# Patient Record
Sex: Male | Born: 1965 | Race: White | Hispanic: No | Marital: Married | Smoking: Former smoker
Health system: Southern US, Community
[De-identification: ages and names within clinical notes are randomized; demographics above are authoritative.]

## PROBLEM LIST (undated history)

## (undated) DIAGNOSIS — Z789 Other specified health status: Secondary | ICD-10-CM

## (undated) HISTORY — PX: ANKLE SURGERY: SHX546

## (undated) HISTORY — PX: SHOULDER SURGERY: SHX246

## (undated) HISTORY — PX: KNEE ARTHROSCOPY: SHX127

## (undated) HISTORY — PX: INGUINAL HERNIA REPAIR: SHX194

---

## 2009-09-25 ENCOUNTER — Ambulatory Visit: Payer: Self-pay | Admitting: Family Medicine

## 2011-12-17 ENCOUNTER — Other Ambulatory Visit: Payer: Self-pay | Admitting: Orthopedic Surgery

## 2011-12-17 DIAGNOSIS — R52 Pain, unspecified: Secondary | ICD-10-CM

## 2011-12-17 DIAGNOSIS — M25619 Stiffness of unspecified shoulder, not elsewhere classified: Secondary | ICD-10-CM

## 2011-12-22 ENCOUNTER — Other Ambulatory Visit: Payer: Self-pay

## 2011-12-23 ENCOUNTER — Ambulatory Visit
Admission: RE | Admit: 2011-12-23 | Discharge: 2011-12-23 | Disposition: A | Discharge status: Home / Self Care | Payer: BC Managed Care – PPO | Source: Ambulatory Visit | Attending: Orthopedic Surgery | Admitting: Orthopedic Surgery

## 2011-12-23 DIAGNOSIS — M25619 Stiffness of unspecified shoulder, not elsewhere classified: Secondary | ICD-10-CM

## 2011-12-23 DIAGNOSIS — R52 Pain, unspecified: Secondary | ICD-10-CM

## 2012-03-07 ENCOUNTER — Encounter (HOSPITAL_BASED_OUTPATIENT_CLINIC_OR_DEPARTMENT_OTHER): Payer: Self-pay | Admitting: *Deleted

## 2012-03-07 ENCOUNTER — Other Ambulatory Visit: Payer: Self-pay | Admitting: Orthopedic Surgery

## 2012-03-13 ENCOUNTER — Ambulatory Visit (HOSPITAL_BASED_OUTPATIENT_CLINIC_OR_DEPARTMENT_OTHER): Payer: BC Managed Care – PPO | Admitting: Anesthesiology

## 2012-03-13 ENCOUNTER — Encounter (HOSPITAL_BASED_OUTPATIENT_CLINIC_OR_DEPARTMENT_OTHER): Payer: Self-pay | Admitting: Anesthesiology

## 2012-03-13 ENCOUNTER — Encounter (HOSPITAL_BASED_OUTPATIENT_CLINIC_OR_DEPARTMENT_OTHER)
Admission: RE | Disposition: A | Discharge status: Home / Self Care | Payer: Self-pay | Source: Ambulatory Visit | Attending: Orthopedic Surgery

## 2012-03-13 ENCOUNTER — Encounter (HOSPITAL_BASED_OUTPATIENT_CLINIC_OR_DEPARTMENT_OTHER): Payer: Self-pay | Admitting: Certified Registered"

## 2012-03-13 ENCOUNTER — Ambulatory Visit (HOSPITAL_BASED_OUTPATIENT_CLINIC_OR_DEPARTMENT_OTHER)
Admission: RE | Admit: 2012-03-13 | Discharge: 2012-03-13 | Disposition: A | Discharge status: Home / Self Care | Payer: BC Managed Care – PPO | Source: Ambulatory Visit | Attending: Orthopedic Surgery | Admitting: Orthopedic Surgery

## 2012-03-13 DIAGNOSIS — M659 Unspecified synovitis and tenosynovitis, unspecified site: Secondary | ICD-10-CM | POA: Insufficient documentation

## 2012-03-13 DIAGNOSIS — M19019 Primary osteoarthritis, unspecified shoulder: Secondary | ICD-10-CM

## 2012-03-13 DIAGNOSIS — M24119 Other articular cartilage disorders, unspecified shoulder: Secondary | ICD-10-CM | POA: Insufficient documentation

## 2012-03-13 HISTORY — PX: SHOULDER ARTHROSCOPY: SHX128

## 2012-03-13 HISTORY — DX: Other specified health status: Z78.9

## 2012-03-13 SURGERY — ARTHROSCOPY, SHOULDER
Anesthesia: General | Site: Shoulder | Laterality: Left | Wound class: Clean

## 2012-03-13 MED ORDER — OXYCODONE-ACETAMINOPHEN 5-325 MG PO TABS: 1.0000 | ORAL_TABLET | ORAL | Status: AC | PRN

## 2012-03-13 MED ORDER — MIDAZOLAM HCL 2 MG/2ML IJ SOLN
1.0000 mg | INTRAMUSCULAR | Status: DC | PRN
  Administered 2012-03-13: 2 mg via INTRAVENOUS

## 2012-03-13 MED ORDER — FENTANYL CITRATE 0.05 MG/ML IJ SOLN
INTRAMUSCULAR | Status: DC | PRN
  Administered 2012-03-13: 25 ug via INTRAVENOUS

## 2012-03-13 MED ORDER — LACTATED RINGERS IV SOLN
INTRAVENOUS | Status: DC
  Administered 2012-03-13 (×3): via INTRAVENOUS

## 2012-03-13 MED ORDER — FENTANYL CITRATE 0.05 MG/ML IJ SOLN
50.0000 ug | INTRAMUSCULAR | Status: DC | PRN
  Administered 2012-03-13: 100 ug via INTRAVENOUS

## 2012-03-13 MED ORDER — SUCCINYLCHOLINE CHLORIDE 20 MG/ML IJ SOLN
INTRAMUSCULAR | Status: DC | PRN
  Administered 2012-03-13: 100 mg via INTRAVENOUS

## 2012-03-13 MED ORDER — ONDANSETRON HCL 4 MG/2ML IJ SOLN
INTRAMUSCULAR | Status: DC | PRN
  Administered 2012-03-13: 4 mg via INTRAVENOUS

## 2012-03-13 MED ORDER — HYDROMORPHONE HCL PF 1 MG/ML IJ SOLN: 0.2500 mg | INTRAMUSCULAR | Status: DC | PRN

## 2012-03-13 MED ORDER — SODIUM CHLORIDE 0.9 % IR SOLN
Status: DC | PRN
  Administered 2012-03-13: 3000 mL

## 2012-03-13 MED ORDER — CEFAZOLIN SODIUM-DEXTROSE 2-3 GM-% IV SOLR
2.0000 g | INTRAVENOUS | Status: AC
  Administered 2012-03-13: 2 g via INTRAVENOUS

## 2012-03-13 MED ORDER — ONDANSETRON HCL 4 MG/2ML IJ SOLN: 4.0000 mg | Freq: Once | INTRAMUSCULAR | Status: DC | PRN

## 2012-03-13 MED ORDER — DEXAMETHASONE SODIUM PHOSPHATE 4 MG/ML IJ SOLN
INTRAMUSCULAR | Status: DC | PRN
  Administered 2012-03-13: 10 mg via INTRAVENOUS

## 2012-03-13 MED ORDER — POVIDONE-IODINE 7.5 % EX SOLN
Freq: Once | CUTANEOUS | Status: AC
  Administered 2012-03-13: 1 via TOPICAL

## 2012-03-13 MED ORDER — PROPOFOL 10 MG/ML IV EMUL
INTRAVENOUS | Status: DC | PRN
  Administered 2012-03-13: 200 mg via INTRAVENOUS

## 2012-03-13 MED ORDER — LIDOCAINE HCL (CARDIAC) 20 MG/ML IV SOLN
INTRAVENOUS | Status: DC | PRN
  Administered 2012-03-13: 50 mg via INTRAVENOUS

## 2012-03-13 SURGICAL SUPPLY — 88 items
ADH SKN CLS APL DERMABOND .7 (GAUZE/BANDAGES/DRESSINGS)
APL SKNCLS STERI-STRIP NONHPOA (GAUZE/BANDAGES/DRESSINGS)
BENZOIN TINCTURE PRP APPL 2/3 (GAUZE/BANDAGES/DRESSINGS) IMPLANT
BLADE 4.2CUDA (BLADE) IMPLANT
BLADE CUDA 5.5 (BLADE) IMPLANT
BLADE CUTTER GATOR 3.5 (BLADE) IMPLANT
BLADE GREAT WHITE 4.2 (BLADE) IMPLANT
BLADE SURG 15 STRL LF DISP TIS (BLADE) IMPLANT
BLADE SURG 15 STRL SS (BLADE)
BLADE SURG ROTATE 9660 (MISCELLANEOUS) IMPLANT
BLADE VORTEX 6.0 (BLADE) IMPLANT
BUR 3.5 LG SPHERICAL (BURR) IMPLANT
BUR OVAL 4.0 (BURR) ×1 IMPLANT
BUR OVAL 6.0 (BURR) IMPLANT
BURR 3.5 LG SPHERICAL (BURR)
CANISTER OMNI JUG 16 LITER (MISCELLANEOUS) ×2 IMPLANT
CANISTER SUCTION 2500CC (MISCELLANEOUS) IMPLANT
CANNULA 5.75X71 LONG (CANNULA) ×2 IMPLANT
CANNULA TWIST IN 8.25X7CM (CANNULA) IMPLANT
CHLORAPREP W/TINT 26ML (MISCELLANEOUS) ×2 IMPLANT
CLOTH BEACON ORANGE TIMEOUT ST (SAFETY) ×2 IMPLANT
DECANTER SPIKE VIAL GLASS SM (MISCELLANEOUS) IMPLANT
DERMABOND ADVANCED (GAUZE/BANDAGES/DRESSINGS)
DERMABOND ADVANCED .7 DNX12 (GAUZE/BANDAGES/DRESSINGS) IMPLANT
DRAPE INCISE IOBAN 66X45 STRL (DRAPES) ×2 IMPLANT
DRAPE STERI 35X30 U-POUCH (DRAPES) ×2 IMPLANT
DRAPE SURG 17X23 STRL (DRAPES) ×2 IMPLANT
DRAPE U 20/CS (DRAPES) ×1 IMPLANT
DRAPE U-SHAPE 47X51 STRL (DRAPES) ×2 IMPLANT
DRAPE U-SHAPE 76X120 STRL (DRAPES) ×4 IMPLANT
DRSG PAD ABDOMINAL 8X10 ST (GAUZE/BANDAGES/DRESSINGS) ×2 IMPLANT
ELECT REM PT RETURN 9FT ADLT (ELECTROSURGICAL)
ELECTRODE REM PT RTRN 9FT ADLT (ELECTROSURGICAL) ×1 IMPLANT
GAUZE SPONGE 4X4 16PLY XRAY LF (GAUZE/BANDAGES/DRESSINGS) IMPLANT
GAUZE XEROFORM 1X8 LF (GAUZE/BANDAGES/DRESSINGS) ×2 IMPLANT
GLOVE BIO SURGEON STRL SZ 6.5 (GLOVE) ×1 IMPLANT
GLOVE BIO SURGEON STRL SZ7.5 (GLOVE) ×4 IMPLANT
GLOVE BIOGEL PI IND STRL 7.0 (GLOVE) IMPLANT
GLOVE BIOGEL PI IND STRL 8 (GLOVE) ×2 IMPLANT
GLOVE BIOGEL PI INDICATOR 7.0 (GLOVE) ×1
GLOVE BIOGEL PI INDICATOR 8 (GLOVE) ×1
GOWN PREVENTION PLUS XLARGE (GOWN DISPOSABLE) ×3 IMPLANT
NDL 1/2 CIR CATGUT .05X1.09 (NEEDLE) IMPLANT
NDL SCORPION MULTI FIRE (NEEDLE) IMPLANT
NDL SUT 6 .5 CRC .975X.05 MAYO (NEEDLE) IMPLANT
NEEDLE 1/2 CIR CATGUT .05X1.09 (NEEDLE) IMPLANT
NEEDLE MAYO TAPER (NEEDLE)
NEEDLE SCORPION MULTI FIRE (NEEDLE) IMPLANT
NS IRRIG 1000ML POUR BTL (IV SOLUTION) IMPLANT
PACK ARTHROSCOPY DSU (CUSTOM PROCEDURE TRAY) ×2 IMPLANT
PACK BASIN DAY SURGERY FS (CUSTOM PROCEDURE TRAY) ×2 IMPLANT
PENCIL BUTTON HOLSTER BLD 10FT (ELECTRODE) IMPLANT
RESECTOR FULL RADIUS 4.2MM (BLADE) ×2 IMPLANT
RESECTOR FULL RADIUS 4.8MM (BLADE) IMPLANT
SHEET MEDIUM DRAPE 40X70 STRL (DRAPES) ×1 IMPLANT
SLEEVE SCD COMPRESS KNEE MED (MISCELLANEOUS) ×2 IMPLANT
SLING ARM FOAM STRAP LRG (SOFTGOODS) IMPLANT
SLING ARM FOAM STRAP MED (SOFTGOODS) IMPLANT
SLING ARM FOAM STRAP XLG (SOFTGOODS) ×1 IMPLANT
SLING ARM IMMOBILIZER MED (SOFTGOODS) IMPLANT
SPONGE GAUZE 4X4 12PLY (GAUZE/BANDAGES/DRESSINGS) ×2 IMPLANT
SPONGE LAP 4X18 X RAY DECT (DISPOSABLE) IMPLANT
STRIP CLOSURE SKIN 1/2X4 (GAUZE/BANDAGES/DRESSINGS) IMPLANT
SUCTION FRAZIER TIP 10 FR DISP (SUCTIONS) IMPLANT
SUPPORT WRAP ARM LG (MISCELLANEOUS) IMPLANT
SUT BONE WAX W31G (SUTURE) IMPLANT
SUT ETHIBOND 2 OS 4 DA (SUTURE) IMPLANT
SUT ETHILON 3 0 PS 1 (SUTURE) ×2 IMPLANT
SUT ETHILON 4 0 PS 2 18 (SUTURE) IMPLANT
SUT FIBERWIRE #2 38 T-5 BLUE (SUTURE)
SUT FIBERWIRE 2-0 18 17.9 3/8 (SUTURE)
SUT MNCRL AB 3-0 PS2 18 (SUTURE) IMPLANT
SUT MNCRL AB 4-0 PS2 18 (SUTURE) IMPLANT
SUT PDS AB 0 CT 36 (SUTURE) IMPLANT
SUT PROLENE 3 0 PS 2 (SUTURE) IMPLANT
SUT VIC AB 0 CT1 18XCR BRD 8 (SUTURE) IMPLANT
SUT VIC AB 0 CT1 8-18 (SUTURE)
SUT VIC AB 2-0 SH 18 (SUTURE) IMPLANT
SUTURE FIBERWR #2 38 T-5 BLUE (SUTURE) IMPLANT
SUTURE FIBERWR 2-0 18 17.9 3/8 (SUTURE) IMPLANT
SYR BULB 3OZ (MISCELLANEOUS) IMPLANT
TOWEL OR 17X24 6PK STRL BLUE (TOWEL DISPOSABLE) ×2 IMPLANT
TOWEL OR NON WOVEN STRL DISP B (DISPOSABLE) ×2 IMPLANT
TUBE CONNECTING 20X1/4 (TUBING) ×2 IMPLANT
TUBING ARTHROSCOPY IRRIG 16FT (MISCELLANEOUS) ×2 IMPLANT
WAND STAR VAC 90 (SURGICAL WAND) ×2 IMPLANT
WATER STERILE IRR 1000ML POUR (IV SOLUTION) ×2 IMPLANT
YANKAUER SUCT BULB TIP NO VENT (SUCTIONS) IMPLANT

## 2012-03-13 NOTE — Transfer of Care (Signed)
Immediate Anesthesia Transfer of Care Note  Patient: Leon Coffey  Procedure(s) Performed: Procedure(s) (LRB): ARTHROSCOPY SHOULDER (Left)  Patient Location: PACU  Anesthesia Type: GA combined with regional for post-op pain  Level of Consciousness: awake, alert , oriented and patient cooperative  Airway & Oxygen Therapy: Patient Spontanous Breathing and Patient connected to face mask oxygen  Post-op Assessment: Report given to PACU RN and Post -op Vital signs reviewed and stable  Post vital signs: Reviewed and stable  Complications: No apparent anesthesia complications

## 2012-03-13 NOTE — H&P (Signed)
Leon Coffey is an 46 y.o. male.   Chief Complaint: L shoulder pain  HPI: L shoulder pain, mechanical symptoms after prior bony stabilization procedure with loose bodies.   Past Medical History  Diagnosis Date  . No pertinent past medical history     Past Surgical History  Procedure Date  . Inguinal hernia repair     left  . Knee arthroscopy     right  . Shoulder surgery     pinning lt shoulder age 65  . Ankle surgery     right cyst removal and bunionectomy    No family history on file. Social History:  reports that he quit smoking about 18 years ago. He does not have any smokeless tobacco history on file. He reports that he drinks alcohol. He reports that he does not use illicit drugs.  Allergies: No Known Allergies  Medications Prior to Admission  Medication Sig Dispense Refill  . acetaminophen (TYLENOL) 325 MG tablet Take 650 mg by mouth every 6 (six) hours as needed.        No results found for this or any previous visit (from the past 48 hour(s)). No results found.  Review of Systems  All other systems reviewed and are negative.    Blood pressure 127/68, pulse 86, temperature 98.7 F (37.1 C), temperature source Oral, resp. rate 16, height 5\' 7"  (1.702 m), weight 88.451 kg (195 lb), SpO2 100.00%. Physical Exam  Constitutional: He is oriented to person, place, and time. He appears well-developed and well-nourished.  HENT:  Head: Atraumatic.  Eyes: EOM are normal.  Cardiovascular: Intact distal pulses.   Respiratory: Effort normal.  Musculoskeletal:       Left shoulder: He exhibits pain.  Neurological: He is alert and oriented to person, place, and time.  Skin: Skin is warm and dry.  Psychiatric: He has a normal mood and affect.     Assessment/Plan L shoulder early OA, loose bodies Plan arth debridement and removal of LBs Risks / benefits of surgery discussed Consent on chart  NPO for OR Preop antibiotics   Kaithlyn Teagle WILLIAM 03/13/2012,  12:06 PM

## 2012-03-13 NOTE — Op Note (Signed)
Procedure(s): ARTHROSCOPY SHOULDER Procedure Note  Leon Coffey male 46 y.o. 03/13/2012  Procedure(s) and Anesthesia Type:    * ARTHROSCOPY SHOULDER - General #1 left shoulder arthroscopic extensive debridement of glenohumeral arthritis, superior labral tear, and extensive synovitis.  Postoperative diagnosis: Left shoulder osteoarthritis, superior labral tear, extensive synovitis.  Surgeon(s) and Role:    * Mable Paris, MD - Primary     Surgeon: Mable Paris   Assistants: None  Anesthesia: General endotracheal anesthesia with preoperative interscalene block   Procedure Detail  ARTHROSCOPY SHOULDER  Estimated Blood Loss: Min         Drains: none  Blood Given: none         Specimens: none        Complications:  * No complications entered in OR log *         Disposition: PACU - hemodynamically stable.         Condition: stable    Procedure:   INDICATIONS FOR SURGERY: The patient is 46 y.o. male who had a history of a bone block procedure for instability many years ago. Began having more pain and some mechanical symptoms in the shoulder. He was noted to have some early arthritis and some chondral loose bodies in the joint. He was indicated for arthroscopic treatment to debride the arthritis and loose bodies. We talked about the presence of the anterior screw. Based on the CT scan this was felt to be in good position and not likely to be prominent. We talked about examining it arthroscopically if it appeared to be past the plane of the glenoid and causing mechanical irritation that we could remove it. Otherwise we would likely leave it intact.  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 The remainder of the humeral head looked good. Labrum: Degenerative. The superior labrum was torn and subluxating into the joint. This was debrided back to stable base. Has not felt the biceps root was involved. The biceps tendon itself had some fraying which was debrided. The remainder the biceps looked healthy. Loose bodies: There were some tiny chondral loose fragments which were debrided with the shaver. No large loose bodies were noted. Synovitis: There is some mild diffuse synovitis throughout the joint. Articular sided rotator cuff: Intact. Mild fraying, debrided  DESCRIPTION OF PROCEDURE: The patient was identified in preoperative  holding area where I personally marked the operative site after  verifying site, side, and procedure with the patient. An interscalene block was given by the attending anesthesiologist the holding area.  The patient was taken back to the operating room where general anesthesia was induced without complication and was placed in the beach-chair position with the back  elevated about 60 degrees and all extremities and head and neck carefully padded and  positioned.   The left upper extremity was then prepped and  draped in a standard sterile fashion. The appropriate time-out  procedure was carried out. The patient did receive IV antibiotics  within 30 minutes of incision.   A small posterior portal incision was made and the arthroscope was introduced into the joint. An anterior portal was then established above the subscapularis using needle localization. Small cannula was placed anteriorly. Diagnostic arthroscopy was then carried out with findings as  described above.  The shaver was used through the anterior portal to debride the extensive degenerative labral tearing both anteriorly and superiorly. The articular cartilage was carefully examined. He had some extensive grade 3 and 4 changes in the posterior inferior aspect of the  glenoid. This was debrided down to stable cartilage with no displaceable flaps. The cartilage on the humeral head was largely intact. There was a stripe of anterior cartilage loss on the anterior quarter of the humeral head which had some areas of grade 4 changes. The subscapularis anteriorly was degenerative and thin. This was debrided. The biceps tendon was pulled into the joint and had some mild partial tearing which was debrided but largely intact. Superior labrum was debrided back to stable labrum. The biceps root was not felt to be involved. The axillary recess was examined. There were no loose bodies in this area. There was a small area of some darkened synovium consistent with a little area of metallosis. This was debrided. The screw and washer were found anteriorly. This was carefully viewed from multiple different angles and felt to be medial to the plane of the articular surface. I viewed from anterior and posterior portals and brought the arms through a range of motion ensuring that the humeral head was never in contact with the screw. I did place a screw driver through the anterior portal to see if the screw would come out easily. It would not come easy and was beginning to have some mental scrapings in the joint. Therefore I felt that the potential harm of removing the screw was probably greater than any benefit and left it intact. The undersurface of the rotator cuff was carefully examined. There was some partial thickness fraying which was debrided but the remainder the rotator cuff appeared very healthy. Viewing from the anterior portal  I used the shaver from the posterior portal to debride the chondromalacia of the glenoid.  The arthroscopic equipment was removed from the joint and the portals were closed with 3-0 nylon in an interrupted fashion. Sterile dressings were then applied including Xeroform 4 x 4's ABDs and tape. The patient was then allowed to awaken from general anesthesia, placed in  a sling, transferred to the stretcher and taken to the recovery room in stable condition.   POSTOPERATIVE PLAN: The patient will be discharged home today and will followup in one week for suture removal and wound check.  He'll begin some gentle range of motion exercises when comfortable.

## 2012-03-13 NOTE — Anesthesia Preprocedure Evaluation (Signed)
Anesthesia Evaluation  Patient identified by MRN, date of birth, ID band Patient awake    Reviewed: Allergy & Precautions, H&P , NPO status   Airway Mallampati: II TM Distance: >3 FB Neck ROM: Full    Dental  (+) Teeth Intact   Pulmonary  breath sounds clear to auscultation        Cardiovascular Rhythm:Regular Rate:Normal     Neuro/Psych    GI/Hepatic   Endo/Other    Renal/GU      Musculoskeletal   Abdominal   Peds  Hematology   Anesthesia Other Findings   Reproductive/Obstetrics                           Anesthesia Physical Anesthesia Plan  ASA: I  Anesthesia Plan: General   Post-op Pain Management:    Induction: Intravenous  Airway Management Planned: Oral ETT  Additional Equipment:   Intra-op Plan:   Post-operative Plan: Extubation in OR  Informed Consent: I have reviewed the patients History and Physical, chart, labs and discussed the procedure including the risks, benefits and alternatives for the proposed anesthesia with the patient or authorized representative who has indicated his/her understanding and acceptance.   Dental advisory given  Plan Discussed with:   Anesthesia Plan Comments: (Plan GA with interscalene block.  Kipp Brood, MD)        Anesthesia Quick Evaluation

## 2012-03-13 NOTE — Discharge Instructions (Signed)
Post Anesthesia Home Care Instructions  Activity: Get plenty of rest for the remainder of the day. A responsible adult should stay with you for 24 hours following the procedure.  For the next 24 hours, DO NOT: -Drive a car -Advertising copywriter -Drink alcoholic beverages -Take any medication unless instructed by your physician -Make any legal decisions or sign important papers.  Meals: Start with liquid foods such as gelatin or soup. Progress to regular foods as tolerated. Avoid greasy, spicy, heavy foods. If nausea and/or vomiting occur, drink only clear liquids until the nausea and/or vomiting subsides. Call your physician if vomiting continues.  Special Instructions/Symptoms: Your throat may feel dry or sore from the anesthesia or the breathing tube placed in your throat during surgery. If this causes discomfort, gargle with warm salt water. The discomfort should disappear within 24 hours.  Regional Anesthesia Blocks  1. Numbness or the inability to move the "blocked" extremity may last from 3-48 hours after placement. The length of time depends on the medication injected and your individual response to the medication. If the numbness is not going away after 48 hours, call your surgeon.  2. The extremity that is blocked will need to be protected until the numbness is gone and the  Strength has returned. Because you cannot feel it, you will need to take extra care to avoid injury. Because it may be weak, you may have difficulty moving it or using it. You may not know what position it is in without looking at it while the block is in effect.  3. For blocks in the legs and feet, returning to weight bearing and walking needs to be done carefully. You will need to wait until the numbness is entirely gone and the strength has returned. You should be able to move your leg and foot normally before you try and bear weight or walk. You will need someone to be with you when you first try to ensure you  do not fall and possibly risk injury.  4. Bruising and tenderness at the needle site are common side effects and will resolve in a few days.  5. Persistent numbness or new problems with movement should be communicated to the surgeon or the [facility] [phone] [facility] [phone]).   Shoulder Arthroscopy Because the shoulder is one of the most mobile joints, it is more prone to injury. It is a very shallow ball and socket joint located between the large bone in your upper arm (humerus) and the shoulder blade (scapula). Arthroscopy is a valuable test for evaluating and treating injuries involving the shoulder joint. Arthroscopy is a surgical technique which uses small incisions (cuts by the surgeon) to insert a small telescope like instrument (arthroscope) and other tools into the shoulder. This allows the surgeon to look directly at the problem. When the arthroscope is in the joint, fluid is used to expand the joint space. This allows the surgeon to examine it more easily. The arthroscope then beams light into the joint and sends an image to a TV screen. As your surgeon examines your shoulder, he or she can also repair a number of problems found at the same time. Sometimes the procedure may change to an open surgery. This would happen if the problems are severe enough that they cannot be corrected with just arthroscopy. This is usually a very safe surgery. Rare complications include damage to nerves or blood vessels, excess bleeding, blood clots, infection, and rarely instrument failure. This is  most often performed as a same day surgery. This means you will not have to stay in the hospital overnight. Recovery from this surgery is also much faster than having an open procedure. LET YOUR CAREGIVER KNOW ABOUT:  Allergies.   Medications taken including herbs, eye drops, over the counter medications, and creams.   Use of steroids (by mouth or creams).   Previous  problems with anesthetics or novocaine.   Possibility of pregnancy, if this applies.   History of blood clots (thrombophlebitis).   History of bleeding or blood problems.   Previous surgery.   Other health problems.   Family history of anesthetic problems.  BEFORE THE PROCEDURE   Stop all anti-inflammatory medications at least one week before surgery unless instructed otherwise. Tell your surgeon if you have been taking cortisone or other steroids.   Do not eat or drink after midnight or as instructed. Take medications as directed by your caregiver. You may have lab tests the morning of surgery.   You should be present 60 minutes prior to your procedure or as directed.  PROCEDURE  You may have general (go to sleep) or local (numb the area) anesthetic. Your surgeon will discuss this with you. During the procedure as discussed above, your surgeon may find a variety of problems which he or she can improve or correct using small instruments. When the procedure is finished the tiny incisions will be closed with stitches or tape. AFTER YOUR PROCEDURE  After surgery you will be taken to the recovery area. A nurse will watch and check your progress. Once you are awake, stable, and taking fluids well, barring other problems you will be allowed to go home.   Once home, apply an ice pack to your operative site for twenty minutes, three to four times per day, for two to three days. This may help with discomfort and keep the swelling down.   Use a sling and medications if prescribed or as instructed.   Unless your caregiver advises otherwise, move your arm and shoulder gently and frequently following the procedure. This can help prevent stiffness and swelling.  REHABILITATION  Almost as important as your surgery is your rehabilitation. If physical therapy and exercises are prescribed by your surgeon, follow them diligently. Once comfortable and on your way to full use, do muscle strengthening  exercises as instructed.   Only take over-the-counter or prescription medicines for pain, discomfort, or fever as directed by your caregiver.  SEEK IMMEDIATE MEDICAL CARE IF:   There is redness, swelling, or increasing pain in the wound or joint.   You notice purulent (colored- pus-like) drainage coming from the wound.   An unexplained oral temperature above 102 F (38.9 C) develops.   You notice a foul smell coming from the wound or dressing.   There is a breaking open of the wound. The edges do not stay together after sutures or tape has been removed.   Persistent bleeding from the small incision.    Document Released: 09/10/2000 Document Revised: 09/02/2011 Document Reviewed: 12/30/2008 [facility] Patient Information 2012 [place], [place].

## 2012-03-13 NOTE — Anesthesia Procedure Notes (Addendum)
Procedure Name: Intubation Date/Time: 03/13/2012 12:26 PM Performed by: Gar Gibbon Pre-anesthesia Checklist: Patient identified, Emergency Drugs available, Suction available and Patient being monitored Patient Re-evaluated:Patient Re-evaluated prior to inductionOxygen Delivery Method: Circle System Utilized Preoxygenation: Pre-oxygenation with 100% oxygen Intubation Type: IV induction Ventilation: Mask ventilation without difficulty Laryngoscope Size: Mac and 3 Grade View: Grade II Tube type: Oral Tube size: 8.0 mm Number of attempts: 1 Airway Equipment and Method: stylet and oral airway Placement Confirmation: ETT inserted through vocal cords under direct vision,  positive ETCO2 and breath sounds checked- equal and bilateral Secured at: 22 cm Tube secured with: Tape Dental Injury: Teeth and Oropharynx as per pre-operative assessment    Anesthesia Regional Block:  Interscalene brachial plexus block  Pre-Anesthetic Checklist: ,, timeout performed, Correct Patient, Correct Site, Correct Laterality, Correct Procedure, Correct Position, site marked, Risks and benefits discussed,  Surgical consent,  Pre-op evaluation,  At surgeon's request and post-op pain management  Laterality: Left  Prep: chloraprep       Needles:   Needle Type: Echogenic Stimulator Needle     Needle Length:cm 9 cm Needle Gauge: 22 and 22 G    Additional Needles:  Procedures: Doppler guided, ultrasound guided and nerve stimulator Interscalene brachial plexus block Narrative:  Start time: 03/13/2012 12:00 PM End time: 03/13/2012 12:10 PM  Performed by: Personally   Additional Notes: 20 cc 0.5% marcaine with 1:200 Epi injected easily.  Kipp Brood, MD

## 2012-03-13 NOTE — Progress Notes (Signed)
Assisted Dr. Joslin with left, ultrasound guided, supraclavicular block. Side rails up, monitors on throughout procedure. See vital signs in flow sheet. Tolerated Procedure well. 

## 2012-03-13 NOTE — Anesthesia Postprocedure Evaluation (Signed)
  Anesthesia Post-op Note  Patient: Leon Coffey  Procedure(s) Performed: Procedure(s) (LRB): ARTHROSCOPY SHOULDER (Left)  Patient Location: PACU  Anesthesia Type: General and GA combined with regional for post-op pain  Level of Consciousness: awake, alert  and oriented  Airway and Oxygen Therapy: Patient Spontanous Breathing  Post-op Pain: none  Post-op Assessment: Post-op Vital signs reviewed and Patient's Cardiovascular Status Stable  Post-op Vital Signs: stable  Complications: No apparent anesthesia complications

## 2012-03-15 ENCOUNTER — Encounter (HOSPITAL_BASED_OUTPATIENT_CLINIC_OR_DEPARTMENT_OTHER): Payer: Self-pay | Admitting: Orthopedic Surgery

## 2012-03-20 ENCOUNTER — Encounter (HOSPITAL_BASED_OUTPATIENT_CLINIC_OR_DEPARTMENT_OTHER): Payer: Self-pay

## 2017-06-08 ENCOUNTER — Other Ambulatory Visit: Payer: Self-pay | Admitting: Orthopedic Surgery

## 2017-06-08 DIAGNOSIS — M5416 Radiculopathy, lumbar region: Secondary | ICD-10-CM

## 2017-06-16 ENCOUNTER — Ambulatory Visit
Admission: RE | Admit: 2017-06-16 | Discharge: 2017-06-16 | Disposition: A | Discharge status: Home / Self Care | Payer: Self-pay | Source: Ambulatory Visit | Attending: Orthopedic Surgery | Admitting: Orthopedic Surgery

## 2017-06-16 DIAGNOSIS — M5416 Radiculopathy, lumbar region: Secondary | ICD-10-CM

## 2021-05-09 ENCOUNTER — Other Ambulatory Visit: Payer: Self-pay | Admitting: Orthopedic Surgery

## 2021-05-09 DIAGNOSIS — M25522 Pain in left elbow: Secondary | ICD-10-CM

## 2021-05-12 ENCOUNTER — Ambulatory Visit
Admission: RE | Admit: 2021-05-12 | Discharge: 2021-05-12 | Disposition: A | Discharge status: Home / Self Care | Payer: Managed Care, Other (non HMO) | Source: Ambulatory Visit | Attending: Orthopedic Surgery | Admitting: Orthopedic Surgery

## 2021-05-12 ENCOUNTER — Other Ambulatory Visit: Payer: Self-pay

## 2021-05-12 DIAGNOSIS — M25522 Pain in left elbow: Secondary | ICD-10-CM

## 2021-05-18 ENCOUNTER — Other Ambulatory Visit: Payer: Self-pay

## 2021-08-31 ENCOUNTER — Other Ambulatory Visit: Payer: Self-pay | Admitting: Orthopedic Surgery

## 2021-08-31 DIAGNOSIS — M19212 Secondary osteoarthritis, left shoulder: Secondary | ICD-10-CM

## 2021-09-12 ENCOUNTER — Ambulatory Visit
Admission: RE | Admit: 2021-09-12 | Discharge: 2021-09-12 | Disposition: A | Discharge status: Home / Self Care | Payer: Managed Care, Other (non HMO) | Source: Ambulatory Visit | Attending: Orthopedic Surgery | Admitting: Orthopedic Surgery

## 2021-09-12 ENCOUNTER — Other Ambulatory Visit: Payer: Self-pay

## 2021-09-12 DIAGNOSIS — M19212 Secondary osteoarthritis, left shoulder: Secondary | ICD-10-CM

## 2023-03-08 ENCOUNTER — Ambulatory Visit: Payer: Managed Care, Other (non HMO) | Admitting: Allergy & Immunology

## 2023-03-08 ENCOUNTER — Encounter: Payer: Self-pay | Admitting: Allergy & Immunology

## 2023-03-08 ENCOUNTER — Other Ambulatory Visit: Payer: Self-pay

## 2023-03-08 VITALS — BP 120/86 | HR 91 | Temp 98.0°F | Resp 16 | Ht 67.0 in | Wt 198.7 lb

## 2023-03-08 DIAGNOSIS — Z880 Allergy status to penicillin: Secondary | ICD-10-CM

## 2023-03-08 DIAGNOSIS — T783XXD Angioneurotic edema, subsequent encounter: Secondary | ICD-10-CM

## 2023-03-08 NOTE — Patient Instructions (Signed)
1. Allergy to amoxicillin - You tolerated the amoxicillin challenge today. - This rules out an amoxicillin allergy. - I am not sure what caused the swelling, but I think that this is safe to give again.  2. Follow up as needed.    Please inform us of any Emergency Department visits, hospitalizations, or changes in symptoms. Call us before going to the ED for breathing or allergy symptoms since we might be able to fit you in for a sick visit. Feel free to contact us anytime with any questions, problems, or concerns.  It was a pleasure to meet you today!  Websites that have reliable patient information: 1. American Academy of Asthma, Allergy, and Immunology: www.aaaai.org 2. Food Allergy Research and Education (FARE): foodallergy.org 3. Mothers of Asthmatics: http://www.asthmacommunitynetwork.org 4. American College of Allergy, Asthma, and Immunology: www.acaai.org   COVID-19 Vaccine Information can be found at: PodExchange.nl For questions related to vaccine distribution or appointments, please email [email or web address] or call [phone].   We realize that you might be concerned about having an allergic reaction to the COVID19 vaccines. To help with that concern, WE ARE OFFERING THE COVID19 VACCINES IN OUR OFFICE! Ask the front desk for dates!     "Like" [place] on Facebook and Instagram for our latest updates!      A healthy democracy works best when [employer] participate! Make sure you are registered to vote! If you have moved or changed any of your contact information, you will need to get this updated before voting!  In some cases, you MAY be able to register to vote online: [email or web address]

## 2023-03-08 NOTE — Progress Notes (Signed)
NEW PATIENT  Date of Service/Encounter:  03/08/23  Consult requested by: Bryon Lions, PA-C   Assessment:   Angioedema from amoxicillin  Allergy to amoxicillin - passed challenge today  Plan/Recommendations:   1. Allergy to amoxicillin - You tolerated the amoxicillin challenge today. - This rules out an amoxicillin allergy. - I am not sure what caused the swelling, but I think that this is safe to give again.  2. Follow up as needed.    This note in its entirety was forwarded to the Provider who requested this consultation.  Subjective:   Leon Coffey is a 57 y.o. male presenting today for evaluation of  Chief Complaint  Patient presents with   Allergic Reaction    Pt states that he maybe allergic to Penicillin/ Amoxicillin 500mg  caused swelling of the lips tongue and throat hard to swallow x 2 ago   Establish Care    [name] has a history of the following: There are no problems to display for this patient.   History obtained from: chart review and patient.  Richelle Ito was referred by Bryon Lions, PA-C.     Harison is a 57 y.o. male presenting for evaluation of a drug allergy.   He has been having issues with a tooth. He got amoxicillin 500mg  to take. The infection did not improve so he got another round of amoxicillin. Then he went to see a different dentist or an oral Careers adviser.   He is going to have a surgical procedure (root canal - third episode) done later in this month. He was starting a third round of amoxicillin and developed lip and tonuge swelling. He did a virtual visit with his PCP.  He had a root canal done in mid December 2023. This was the second root canal. This was the second root canal. He got amoxicillin and did fine with this.   He had mostly tongue swelling and lip swelling. There was no rash at all. Swelling lasted 2-3 hours. It did not last long at all. He did not take anything to treat it, including Benadryl. He did  not have antihistamines at the house at all.   He has no history of swelling aside from this. He has never had stomach pain from an unexplained reason.   He has never had Augmentin or cefdinir or clindamycin. He does have photosensitivity to doxycycline. Otherwise no drug allergies.   Otherwise, there is no history of other atopic diseases, including asthma, food allergies, stinging insect allergies, eczema, urticaria, or contact dermatitis. There is no significant infectious history. Vaccinations are up to date.    Past Medical History: There are no problems to display for this patient.   Medication List:  Allergies as of 03/08/2023       Reactions   Amoxicillin Swelling   Tongue swelling and hand rash   Doxycycline Photosensitivity, Rash        Medication List        Accurate as of March 08, 2023 11:53 AM. If you have any questions, ask your nurse or doctor.          atorvastatin 20 MG tablet Commonly known as: LIPITOR Take by mouth.        Birth History: non-contributory  Developmental History: non-contributory  Past Surgical History: Past Surgical History:  Procedure Laterality Date   ANKLE SURGERY     right cyst removal and bunionectomy   INGUINAL HERNIA REPAIR     left  KNEE ARTHROSCOPY     right   SHOULDER ARTHROSCOPY  03/13/2012   Procedure: ARTHROSCOPY SHOULDER;  Surgeon: Mable Paris, MD;  Location: [facility];  Service: Orthopedics;  Laterality: Left;  WITH LOOSE BODY REMOVAL AND DEBRIDEMENT   SHOULDER SURGERY     pinning lt shoulder age 65     Family History: History reviewed. No pertinent family history.   Social History: Leon Coffey lives at home with his wife. They live in an apartment that is 63 years old. There is laminate flooring throughout the home.  They have carpeting in the bedroom.  They have electric heating and central cooling.  There are no animals inside or outside of the home.  There is no tobacco  exposure.  He currently works as a Naval architect for [employer].  There is no fume, chemical, or dust exposure.  There is no HEPA filter in the home.   Review of Systems  Constitutional: Negative.  Negative for fever, malaise/fatigue and weight loss.  HENT: Negative.  Negative for congestion, ear discharge and ear pain.   Eyes:  Negative for pain, discharge and redness.  Respiratory:  Negative for cough, sputum production, shortness of breath and wheezing.   Cardiovascular: Negative.  Negative for chest pain and palpitations.  Gastrointestinal:  Negative for abdominal pain, heartburn, nausea and vomiting.  Skin: Negative.  Negative for itching and rash.  Neurological:  Negative for dizziness and headaches.  Endo/Heme/Allergies:  Negative for environmental allergies. Does not bruise/bleed easily.       Objective:   Blood pressure 120/86, pulse 91, temperature 98 F (36.7 C), resp. rate 16, height 5\' 7"  (1.702 m), weight 198 lb 11.2 oz (90.1 kg), SpO2 94 %. Body mass index is 31.12 kg/m.     Physical Exam Vitals reviewed.  Constitutional:      Appearance: He is well-developed.     Comments: .  Cooperative with the exam.  HENT:     Head: Normocephalic and atraumatic.     Right Ear: Tympanic membrane, ear canal and external ear normal.     Left Ear: Tympanic membrane, ear canal and external ear normal.     Nose: Mucosal edema present. No nasal deformity, septal deviation or rhinorrhea.     Right Turbinates: Not enlarged or swollen.     Left Turbinates: Not enlarged or swollen.     Right Sinus: No maxillary sinus tenderness or frontal sinus tenderness.     Left Sinus: No maxillary sinus tenderness or frontal sinus tenderness.     Mouth/Throat:     Mouth: Mucous membranes are not pale and not dry.     Pharynx: Uvula midline.  Eyes:     General: Lids are normal. No allergic shiner.       Right eye: No discharge.        Left eye: No discharge.     Conjunctiva/sclera:  Conjunctivae normal.     Right eye: Right conjunctiva is not injected. No chemosis.    Left eye: Left conjunctiva is not injected. No chemosis.    Pupils: Pupils are equal, round, and reactive to light.  Cardiovascular:     Rate and Rhythm: Normal rate and regular rhythm.     Heart sounds: Normal heart sounds.  Pulmonary:     Effort: Pulmonary effort is normal. No tachypnea, accessory muscle usage or respiratory distress.     Breath sounds: Normal breath sounds. No wheezing, rhonchi or rales.  Chest:     Chest  wall: No tenderness.  Lymphadenopathy:     Cervical: No cervical adenopathy.  Skin:    Coloration: Skin is not pale.     Findings: No abrasion, erythema, petechiae or rash. Rash is not papular, urticarial or vesicular.  Neurological:     Mental Status: He is alert.  Psychiatric:        Behavior: Behavior is cooperative.   Friendly  Diagnostic studies:   Allergy Studies:     Oral Challenge - 03/08/23 0900     Challenge Food/Drug Amoxicillin    Lot #  if Applicable [id number]    Food/Drug provided by Office    BP 120/86    Pulse 91    Respirations 16    Lungs 94%    Skin clear    Mouth clear    Time 0912    Dose 1.33mL (10% dose)   Time 0951    Dose 10.8 mL (90% dose)   BP 122/90    Pulse 83    Respirations 20    Lungs 96%    Skin clear    Mouth clear                       Malachi Bonds, MD [facility]

## 2023-03-12 IMAGING — MR MR SHOULDER*L* W/O CM
5 series · 39 of 40 positions shown · non-contrast
Comparison: CT shoulder 12/23/2011

CLINICAL DATA: Left shoulder pain since April 2021. Prior surgery
in 1980s and 3638.

EXAM:
MRI OF THE LEFT SHOULDER WITHOUT CONTRAST
TECHNIQUE: Multiplanar, multisequence MR imaging of the shoulder was performed.
No intravenous contrast was administered.

[Series 3: T2 fat-sat · axial · 4.0mm · 0.27mm/px · z∈[-95,+43]mm · 9 of 30 slices shown (1 of 3)]
[im 1/30]
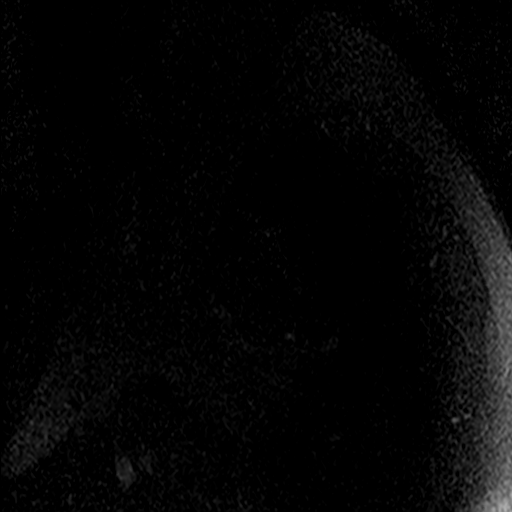
[im 4/30]
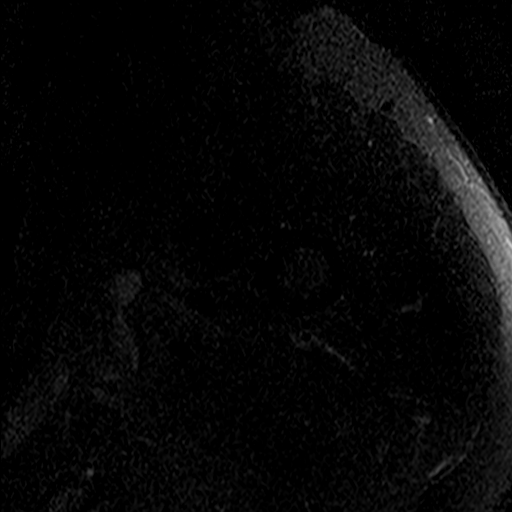
[im 7/30]
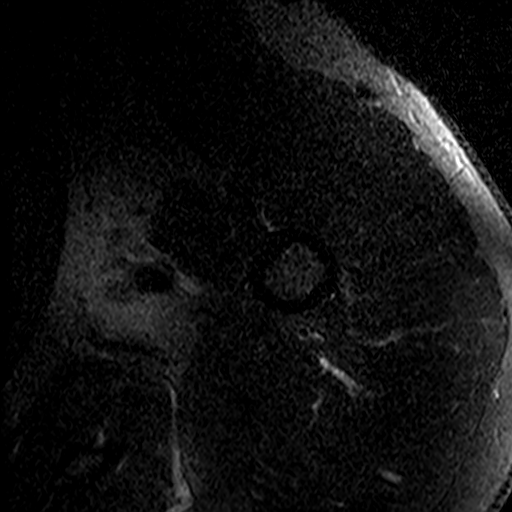
[im 10/30]
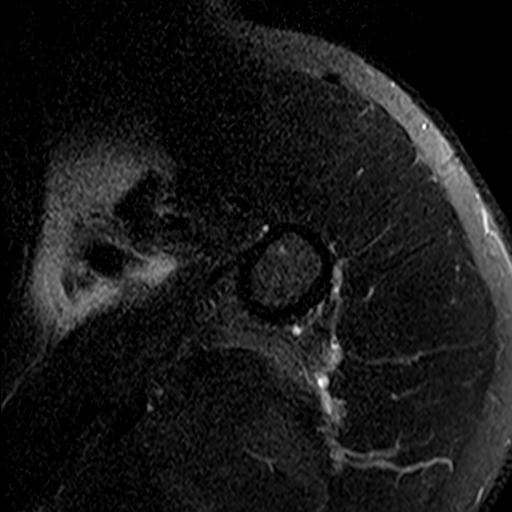
[im 13/30]
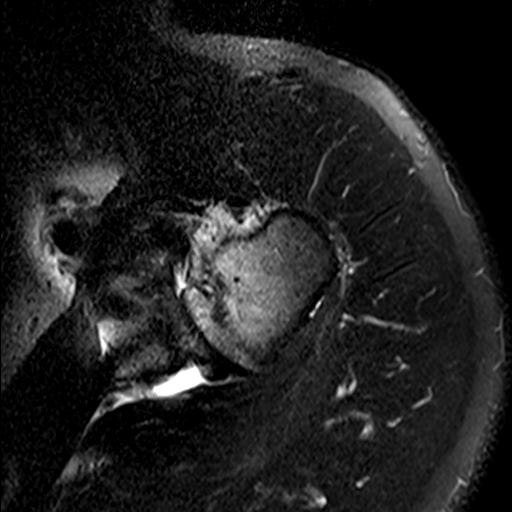
[im 17/30]
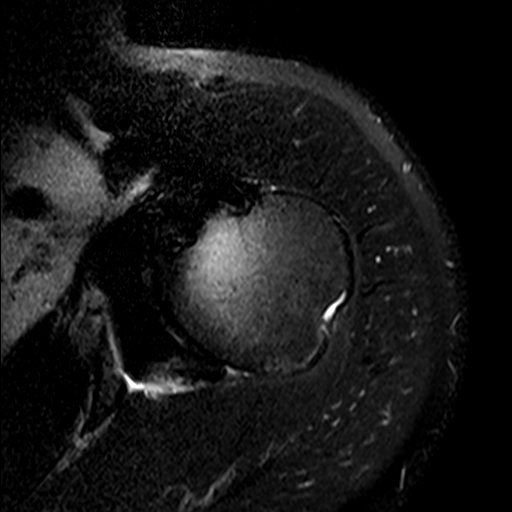
[im 20/30]
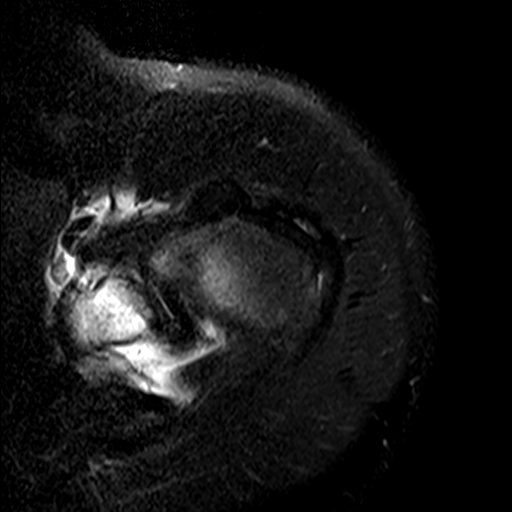
[im 26/30]
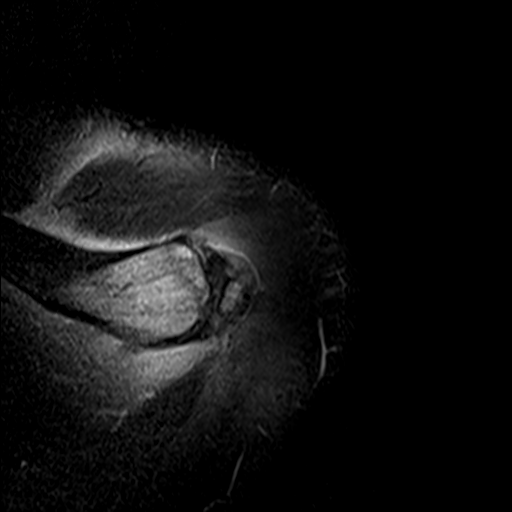
[im 30/30]
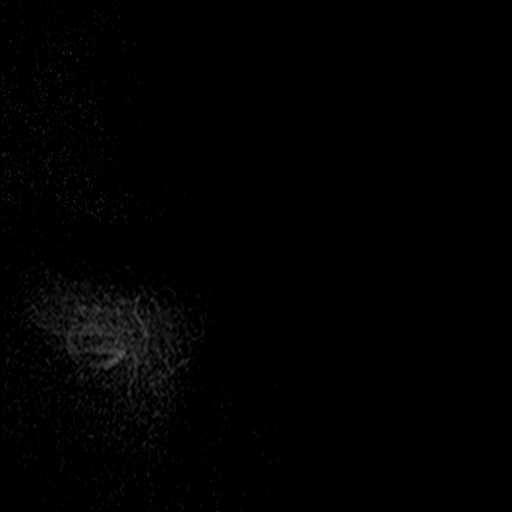

[Series 5: T2 fat-sat · oblique · 4.0mm · 0.59mm/px · 7 of 18 slices shown (2 of 3)]
[im 1/18]
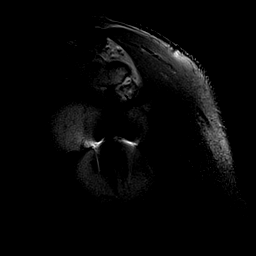
[im 3/18]
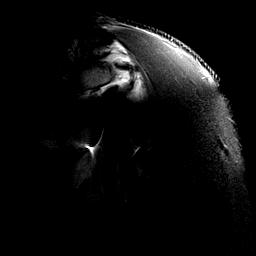
[im 6/18]
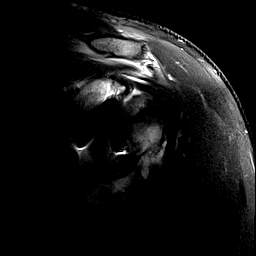
[im 9/18]
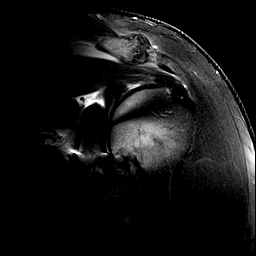
[im 12/18]
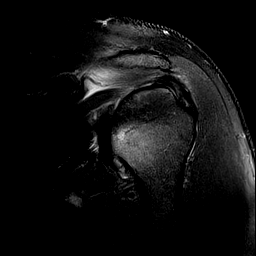
[im 15/18]
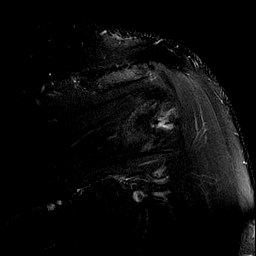
[im 18/18]
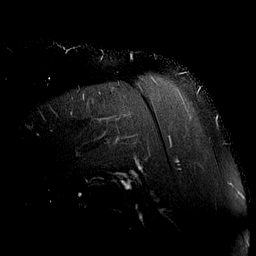

[Series 7: PD · oblique · 4.0mm · 0.59mm/px · 7 of 18 slices shown]
[im 1/18]
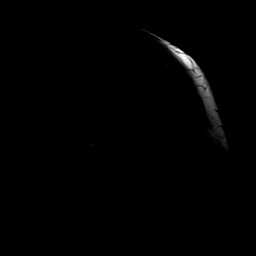
[im 3/18]
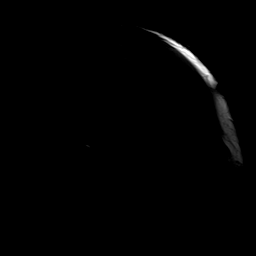
[im 6/18]
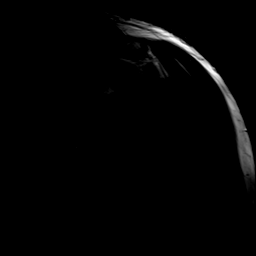
[im 9/18]
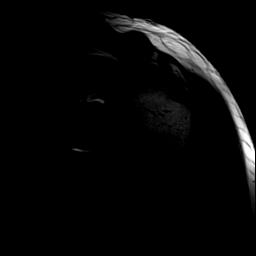
[im 12/18]
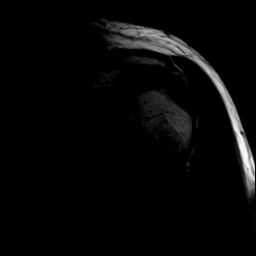
[im 15/18]
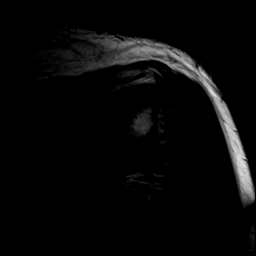
[im 18/18]
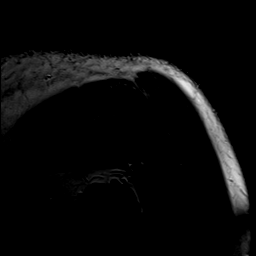

[Series 8: T2 fat-sat · oblique · 4.0mm · 0.59mm/px · 8 of 20 slices shown (3 of 3)]
[im 1/20]
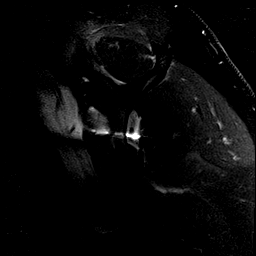
[im 3/20]
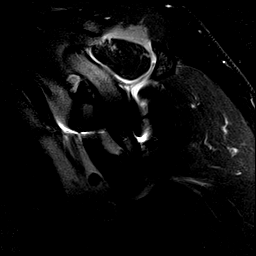
[im 6/20]
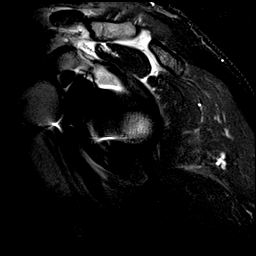
[im 9/20]
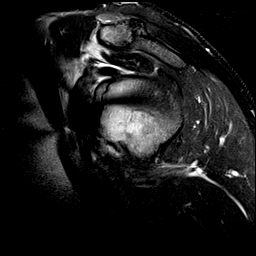
[im 11/20]
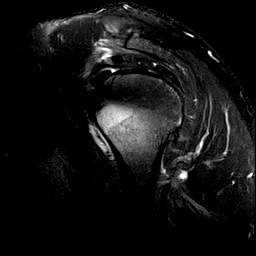
[im 14/20]
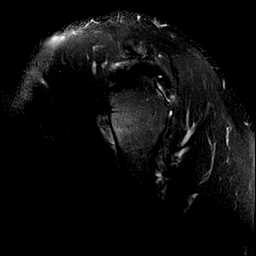
[im 17/20]
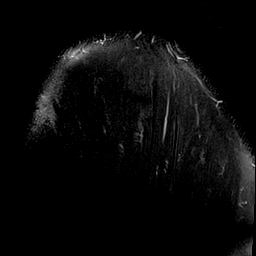
[im 20/20]
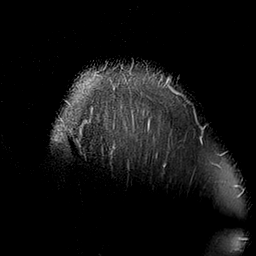

[Series 9: T1 · oblique · 4.0mm · 0.59mm/px · 8 of 20 slices shown]
[im 1/20]
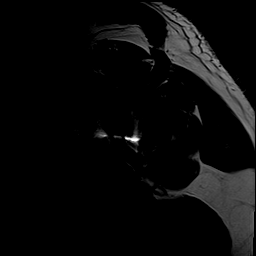
[im 3/20]
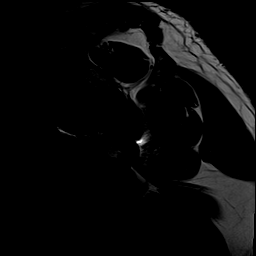
[im 6/20]
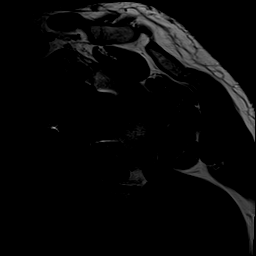
[im 9/20]
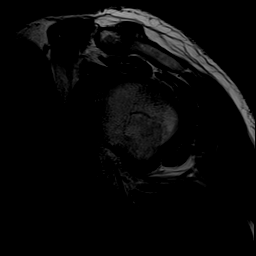
[im 11/20]
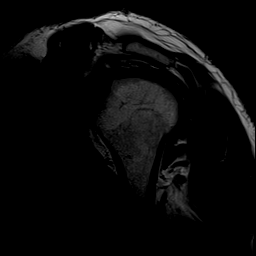
[im 14/20]
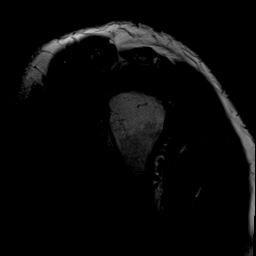
[im 17/20]
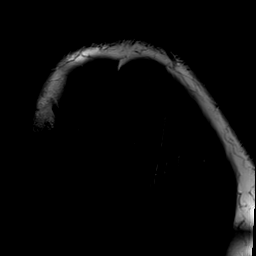
[im 20/20]
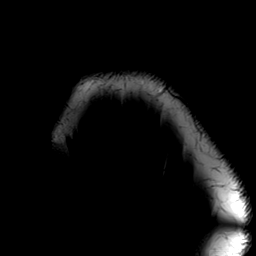

[39 of 40 positions shown; findings below may reference images not displayed]

FINDINGS: Susceptibility artifact resulting from metallic orthopedic hardware
in the glenoid partially obscuring the adjacent soft tissue and
osseous structures.

Rotator cuff: Mild tendinosis of the supraspinatus tendon with a
small insertional interstitial tear. Mild tendinosis of the
infraspinatus tendon with a tiny partial-thickness articular surface
tear. Teres minor tendon is intact. Subscapularis tendon is intact.

Muscles: No muscle atrophy or edema. No intramuscular fluid
collection or hematoma.

Biceps Long Head: Moderate tendinosis of the intra-articular portion
of the long head of the biceps tendon.

Acromioclavicular Joint: Moderate arthropathy of the
acromioclavicular joint. No subacromial/subdeltoid bursal fluid.

Glenohumeral Joint: No joint effusion. Extensive full-thickness
cartilage loss of the glenohumeral joint.

Labrum: Limited evaluation secondary to susceptibility artifact and
lack of intra-articular fluid. Superior labral degeneration.

Bones: Single screw in the inferior glenoid with resulting artifact
obscuring the adjacent soft tissue and osseous structures. No acute
fracture or dislocation. No aggressive osseous lesion.

Other: No fluid collection or hematoma.
IMPRESSION: 1. Severe advanced osteoarthritis of the left glenohumeral joint.
2. Mild tendinosis of the supraspinatus tendon with a small
insertional interstitial tear.
3. Mild tendinosis of the infraspinatus tendon with a tiny
partial-thickness articular surface tear.
4. Moderate tendinosis of the intra-articular portion of the long
head of the biceps tendon.

## 2024-07-18 ENCOUNTER — Encounter: Payer: Self-pay | Admitting: Orthopedic Surgery

## 2024-07-18 ENCOUNTER — Ambulatory Visit: Admitting: Orthopedic Surgery

## 2024-07-18 DIAGNOSIS — M19012 Primary osteoarthritis, left shoulder: Secondary | ICD-10-CM

## 2024-07-18 DIAGNOSIS — M19011 Primary osteoarthritis, right shoulder: Secondary | ICD-10-CM | POA: Diagnosis not present

## 2024-07-18 NOTE — Progress Notes (Unsigned)
 Office Visit Note   Patient: Leon Coffey           Date of Birth: [birth year]           MRN: [id number] Visit Date: 07/18/2024 Requested by: Valma Lannie LABOR, PA-C [address] [place],  [place] [address] PCP: Valma Lannie LABOR DEVONNA  Subjective: Chief Complaint  Patient presents with   Right Shoulder - Pain    HPI: Leon Coffey is a 58 y.o. male who presents to the office reporting for second opinion regarding his right shoulder pain.  Patient has recurrent rotator cuff tear following index surgery about 11 years ago.  Patient describes pain since June.  He is right-hand dominant.  The pain comes and goes.  Radiates down to the elbow at times.  Denies any numbness and tingling but does report some neck pain and spasm.  No scapular pain.  Does not report much in terms of mechanical symptoms in the shoulder.  He does report decree strength particularly with overhead activities.  Hard for him to push things.  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  Hard for him to open the door.  Patient takes ibuprofen for symptoms.  Old records are reviewed.  Someone has recommended reverse shoulder replacement for him.  He is 58.  His MRI scan shows postsurgical changes of the rotator cuff repair with full-thickness recurrent tear of the supraspinatus and infraspinatus.  I reviewed the scan with the patient and there is some tearing and it may or may not be repairable.  There is only mild glenohumeral joint arthritis.  Slight superior elevation of the humeral head.  Notably the patient also had instability surgery on the left shoulder and has developed some instability arthropathy on that side.  At index surgery was done when the patient was 58 years old..                ROS: All systems reviewed are negative as they relate to the chief complaint within the history of present  illness.  Patient denies fevers or chills.  Assessment & Plan: Visit Diagnoses:  1. Bilateral shoulder region arthritis     Plan: Impression is right shoulder pain with recurrent rotator cuff tear with some retraction involving supraspinatus and infraspinatus..  Mild atrophy of the muscle bellies is present.  Subscap is intact.  Overall Niam has pretty good function with that right arm.  Is not perfect but he is get good range of motion and good strength despite his recurrent rotator cuff pathology.  We talked about intra-articular injection which would likely give him temporary relief of his pain.  Recurrent rotator cuff surgery is also discussed which would likely involve dermal patch for augmentation of the repair.  Alternatively he could manage as long as he can with what he has as the anterior posterior force couple between the subscap and the teres does seem to be functioning because he has got pretty good active range of motion above shoulder level.  Plan at this time is for observation.  The main question that he wanted answered today was does he have to get a shoulder replacement in my opinion on that is no.  He is 85 and still does somewhat of a physical job in terms of managing the back door of the [employer] truck.  I think he could wait a little bit longer before pursuing a shoulder replacement.  He will let us  know if he wants any more consultation or intervention.  Follow-up with us  as needed.  Follow-Up Instructions: No follow-ups on file.   Orders:  No orders of the defined types were placed in this encounter.  No orders of the defined types were placed in this encounter.     Procedures: No procedures performed   Clinical Data: No additional findings.  Objective: Vital Signs: There were no vitals taken for this visit.  Physical Exam:  Constitutional: Patient appears well-developed HEENT:  Head: Normocephalic Eyes:EOM are normal Neck: Normal range of  motion Cardiovascular: Normal rate Pulmonary/chest: Effort normal Neurologic: Patient is alert Skin: Skin is warm Psychiatric: Patient has normal mood and affect  Ortho Exam: Ortho exam demonstrates range of motion on the right of 30/100/160 range of motion on the left is 20/85/130.  He does have 5 out of 5 subscap strength on the right left-hand side.  External rotation strength is 5- out of 5 on the right.  He does have active forward flexion abduction well above 90 degrees.  Mild crepitus is present with internal/external rotation on the right-hand side.  Well-healed surgical incisions from prior surgery noted.  Cervical spine range of motion intact.  Specialty Comments:  No specialty comments available.  Imaging: No results found.   PMFS History: There are no active problems to display for this patient.  Past Medical History:  Diagnosis Date   No pertinent past medical history     History reviewed. No pertinent family history.  Past Surgical History:  Procedure Laterality Date   ANKLE SURGERY     right cyst removal and bunionectomy   INGUINAL HERNIA REPAIR     left   KNEE ARTHROSCOPY     right   SHOULDER ARTHROSCOPY  03/13/2012   Procedure: ARTHROSCOPY SHOULDER;  Surgeon: Eva Elsie Herring, MD;  Location: [facility];  Service: Orthopedics;  Laterality: Left;  WITH LOOSE BODY REMOVAL AND DEBRIDEMENT   SHOULDER SURGERY     pinning lt shoulder age 58   Social History   Occupational History   Not on file  Tobacco Use   Smoking status: Former    Current packs/day: 0.00    Types: Cigarettes    Quit date: 03/07/1994    Years since quitting: 30.3    Passive exposure: Never   Smokeless tobacco: Never  Vaping Use   Vaping status: Never Used  Substance and Sexual Activity   Alcohol use: Yes    Alcohol/week: 2.0 standard drinks of alcohol    Types: 2 Glasses of wine per week    Comment: occ   Drug use: No   Sexual activity: Not on file

## 2024-07-30 ENCOUNTER — Encounter: Payer: Self-pay | Admitting: Radiology
